# Patient Record
Sex: Female | Born: 1960 | Race: Black or African American | Hispanic: No | Marital: Single | Smoking: Current every day smoker
Health system: Southern US, Community
[De-identification: ages and names within clinical notes are randomized; demographics above are authoritative.]

## PROBLEM LIST (undated history)

## (undated) ENCOUNTER — Emergency Department (HOSPITAL_COMMUNITY): Payer: Self-pay

## (undated) DIAGNOSIS — T7840XA Allergy, unspecified, initial encounter: Secondary | ICD-10-CM

## (undated) DIAGNOSIS — F17200 Nicotine dependence, unspecified, uncomplicated: Secondary | ICD-10-CM

## (undated) DIAGNOSIS — F1090 Alcohol use, unspecified, uncomplicated: Secondary | ICD-10-CM

## (undated) HISTORY — DX: Allergy, unspecified, initial encounter: T78.40XA

## (undated) HISTORY — DX: Alcohol use, unspecified, uncomplicated: F10.90

## (undated) HISTORY — DX: Nicotine dependence, unspecified, uncomplicated: F17.200

---

## 1999-07-12 ENCOUNTER — Encounter: Payer: Self-pay | Admitting: Emergency Medicine

## 1999-07-12 ENCOUNTER — Emergency Department (HOSPITAL_COMMUNITY): Admission: EM | Admit: 1999-07-12 | Discharge: 1999-07-12 | Payer: Self-pay | Admitting: Emergency Medicine

## 2000-02-08 ENCOUNTER — Emergency Department (HOSPITAL_COMMUNITY): Admission: EM | Admit: 2000-02-08 | Discharge: 2000-02-08 | Payer: Self-pay | Admitting: Emergency Medicine

## 2001-03-03 ENCOUNTER — Emergency Department (HOSPITAL_COMMUNITY): Admission: EM | Admit: 2001-03-03 | Discharge: 2001-03-03 | Payer: Self-pay | Admitting: Emergency Medicine

## 2002-06-10 ENCOUNTER — Emergency Department (HOSPITAL_COMMUNITY): Admission: EM | Admit: 2002-06-10 | Discharge: 2002-06-10 | Payer: Self-pay | Admitting: Emergency Medicine

## 2003-03-15 ENCOUNTER — Emergency Department (HOSPITAL_COMMUNITY): Admission: EM | Admit: 2003-03-15 | Discharge: 2003-03-15 | Payer: Self-pay | Admitting: Emergency Medicine

## 2003-03-15 ENCOUNTER — Encounter: Payer: Self-pay | Admitting: Family Medicine

## 2004-11-06 ENCOUNTER — Emergency Department (HOSPITAL_COMMUNITY): Admission: EM | Admit: 2004-11-06 | Discharge: 2004-11-06 | Payer: Self-pay | Admitting: Emergency Medicine

## 2005-05-23 ENCOUNTER — Emergency Department (HOSPITAL_COMMUNITY): Admission: EM | Admit: 2005-05-23 | Discharge: 2005-05-23 | Payer: Self-pay | Admitting: Emergency Medicine

## 2005-05-28 ENCOUNTER — Emergency Department (HOSPITAL_COMMUNITY): Admission: EM | Admit: 2005-05-28 | Discharge: 2005-05-28 | Payer: Self-pay | Admitting: *Deleted

## 2005-07-11 ENCOUNTER — Emergency Department (HOSPITAL_COMMUNITY): Admission: EM | Admit: 2005-07-11 | Discharge: 2005-07-11 | Payer: Self-pay | Admitting: Emergency Medicine

## 2006-12-19 IMAGING — CR DG KNEE 1-2V*L*
2 series · 2 of 2 positions shown · non-contrast
Comparison: None.

CLINICAL DATA: Knee pain ? no injury. 
 LEFT KNEE - 2 VIEW:

[view not recorded (1 of 2)]
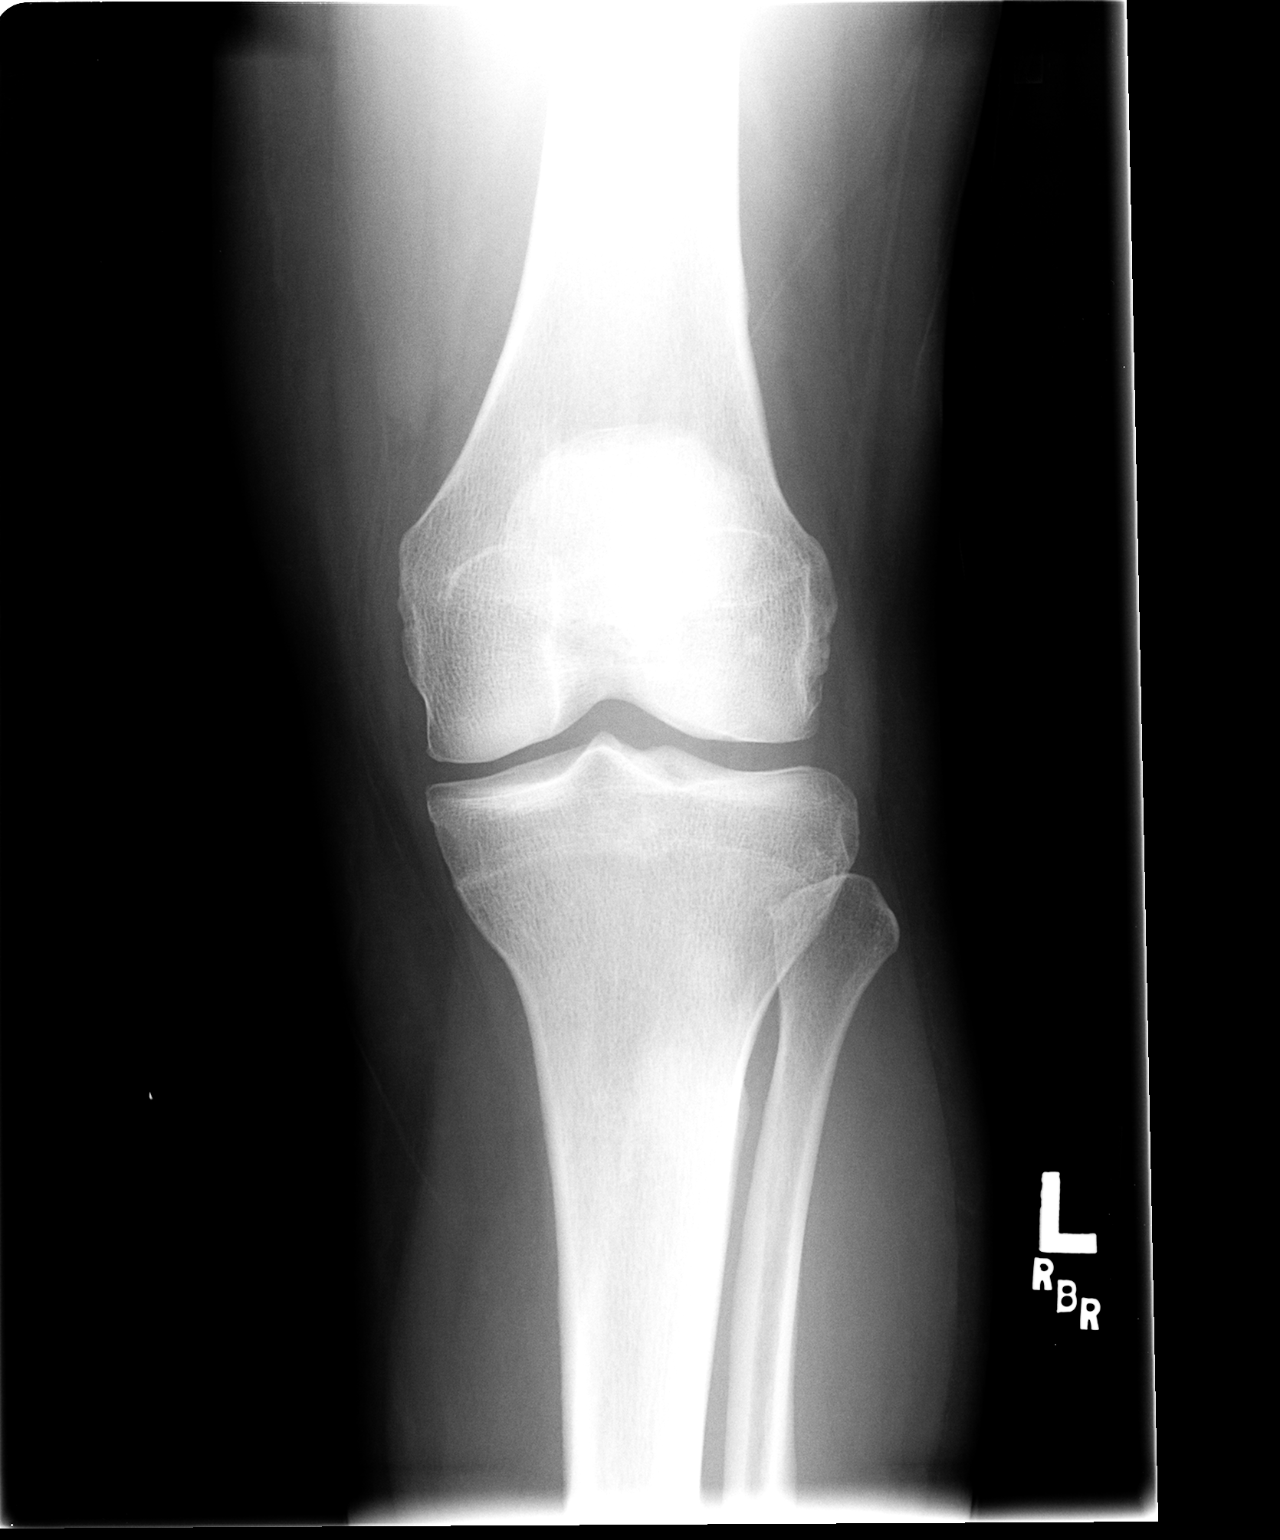

[view not recorded (2 of 2)]
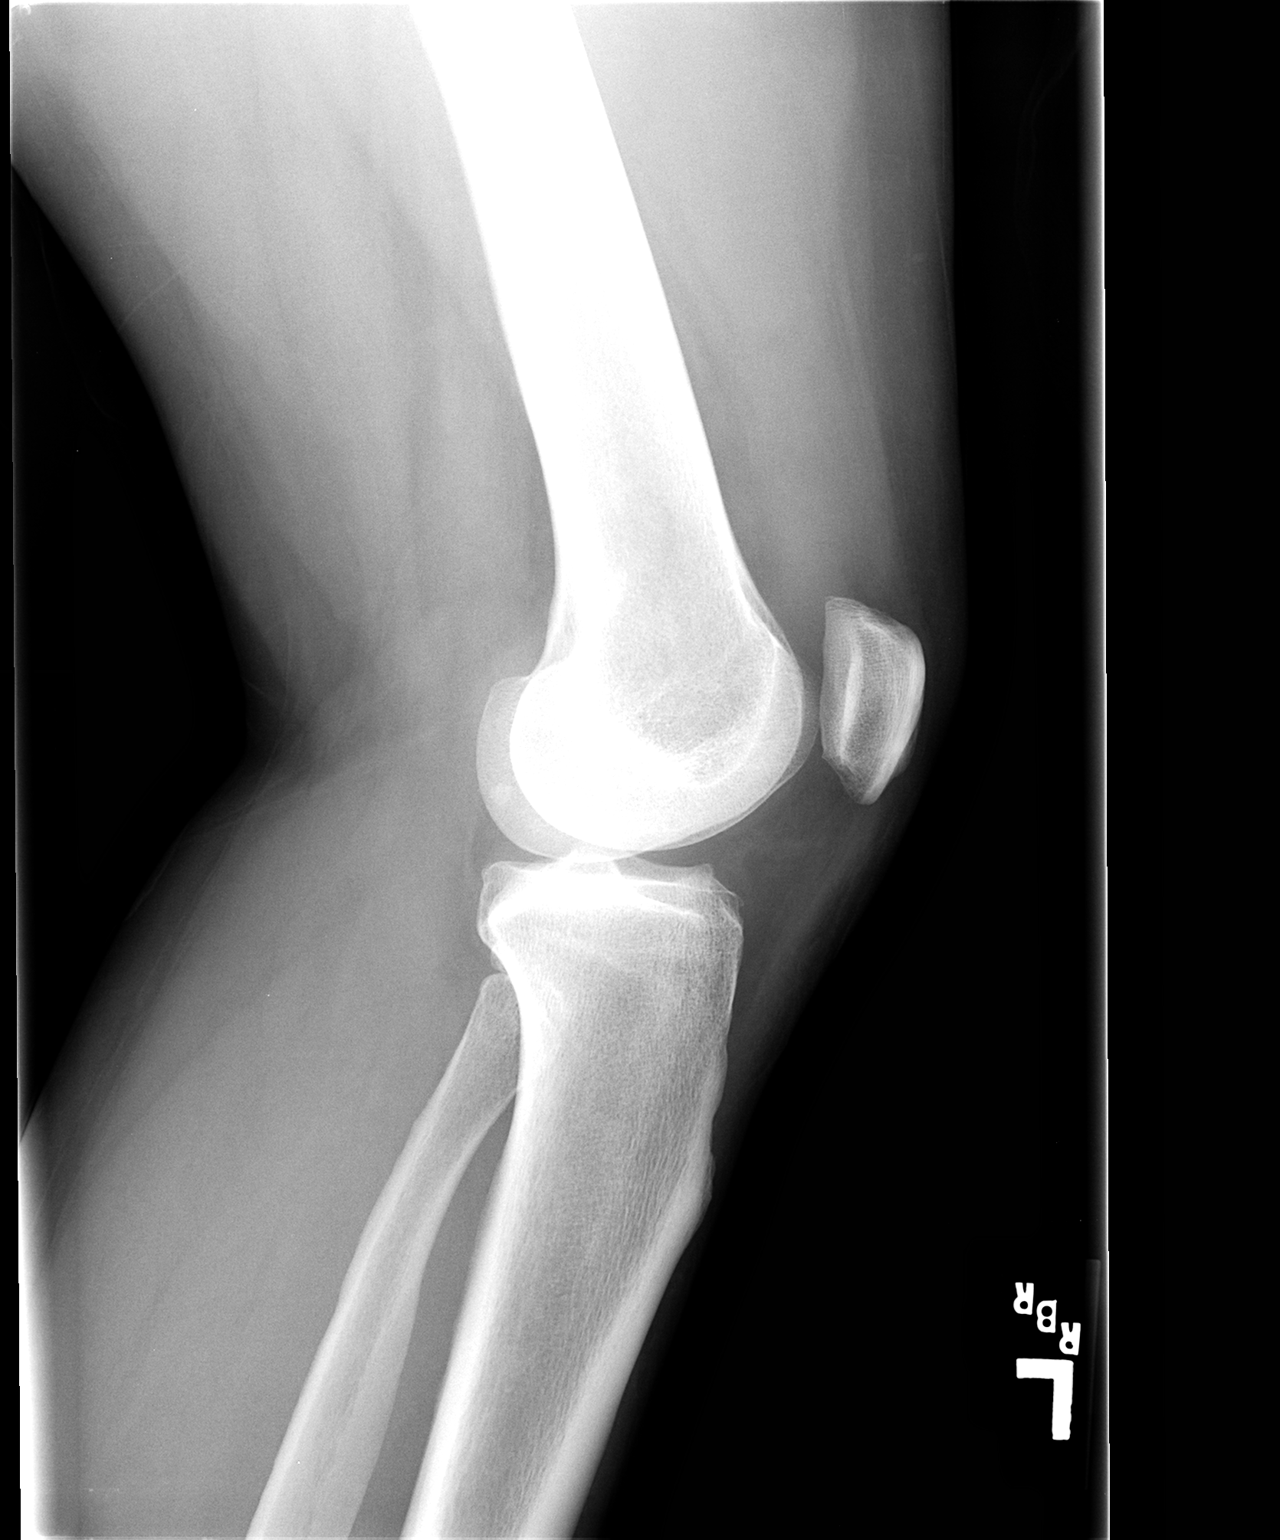

[2 of 2 positions shown; findings below may reference images not displayed]

FINDINGS: There is no acute abnormality or the bony structures.  There is a likely joint effusion on the lateral view.
IMPRESSION: Joint effusion ? bony structures normal.

## 2006-12-24 ENCOUNTER — Emergency Department (HOSPITAL_COMMUNITY): Admission: EM | Admit: 2006-12-24 | Discharge: 2006-12-24 | Payer: Self-pay | Admitting: Emergency Medicine

## 2007-08-08 ENCOUNTER — Emergency Department (HOSPITAL_COMMUNITY): Admission: EM | Admit: 2007-08-08 | Discharge: 2007-08-08 | Payer: Self-pay | Admitting: Emergency Medicine

## 2008-08-30 ENCOUNTER — Emergency Department (HOSPITAL_COMMUNITY): Admission: EM | Admit: 2008-08-30 | Discharge: 2008-08-30 | Payer: Self-pay | Admitting: Emergency Medicine

## 2008-10-25 ENCOUNTER — Emergency Department (HOSPITAL_COMMUNITY): Admission: EM | Admit: 2008-10-25 | Discharge: 2008-10-25 | Payer: Self-pay | Admitting: Emergency Medicine

## 2008-11-04 ENCOUNTER — Emergency Department (HOSPITAL_COMMUNITY): Admission: EM | Admit: 2008-11-04 | Discharge: 2008-11-04 | Payer: Self-pay | Admitting: Emergency Medicine

## 2009-10-10 ENCOUNTER — Emergency Department (HOSPITAL_COMMUNITY): Admission: EM | Admit: 2009-10-10 | Discharge: 2009-10-10 | Payer: Self-pay | Admitting: Emergency Medicine

## 2010-08-24 LAB — URINALYSIS, ROUTINE W REFLEX MICROSCOPIC
Glucose, UA: NEGATIVE mg/dL
Hgb urine dipstick: NEGATIVE
Ketones, ur: NEGATIVE mg/dL
Protein, ur: NEGATIVE mg/dL
pH: 6.5 (ref 5.0–8.0)

## 2010-08-24 LAB — POCT PREGNANCY, URINE: Preg Test, Ur: NEGATIVE

## 2010-08-24 LAB — WET PREP, GENITAL

## 2010-09-14 LAB — URINALYSIS, ROUTINE W REFLEX MICROSCOPIC
Bilirubin Urine: NEGATIVE
Ketones, ur: NEGATIVE mg/dL
Nitrite: NEGATIVE
pH: 6 (ref 5.0–8.0)

## 2010-09-14 LAB — POCT I-STAT, CHEM 8
Calcium, Ion: 1.04 mmol/L — ABNORMAL LOW (ref 1.12–1.32)
Chloride: 109 mEq/L (ref 96–112)
Hemoglobin: 11.9 g/dL — ABNORMAL LOW (ref 12.0–15.0)
Potassium: 3.8 mEq/L (ref 3.5–5.1)
TCO2: 23 mmol/L (ref 0–100)

## 2010-09-14 LAB — WET PREP, GENITAL
Clue Cells Wet Prep HPF POC: NONE SEEN
Yeast Wet Prep HPF POC: NONE SEEN

## 2010-09-14 LAB — GC/CHLAMYDIA PROBE AMP, GENITAL
Chlamydia, DNA Probe: NEGATIVE
GC Probe Amp, Genital: NEGATIVE

## 2010-09-16 LAB — DIFFERENTIAL
Basophils Absolute: 0.1 10*3/uL (ref 0.0–0.1)
Basophils Relative: 1 % (ref 0–1)
Eosinophils Relative: 3 % (ref 0–5)
Monocytes Absolute: 0.3 10*3/uL (ref 0.1–1.0)

## 2010-09-16 LAB — CBC
Platelets: 297 10*3/uL (ref 150–400)
RBC: 3.63 MIL/uL — ABNORMAL LOW (ref 3.87–5.11)
RDW: 19.4 % — ABNORMAL HIGH (ref 11.5–15.5)

## 2010-09-16 LAB — PROTIME-INR: Prothrombin Time: 12.9 seconds (ref 11.6–15.2)

## 2016-09-05 ENCOUNTER — Emergency Department (HOSPITAL_COMMUNITY)
Admission: EM | Admit: 2016-09-05 | Discharge: 2016-09-05 | Disposition: A | Discharge status: Home / Self Care | Payer: Self-pay | Attending: Emergency Medicine | Admitting: Emergency Medicine

## 2016-09-05 ENCOUNTER — Encounter (HOSPITAL_COMMUNITY): Payer: Self-pay

## 2016-09-05 ENCOUNTER — Emergency Department (HOSPITAL_COMMUNITY): Payer: Self-pay

## 2016-09-05 DIAGNOSIS — R1030 Lower abdominal pain, unspecified: Secondary | ICD-10-CM

## 2016-09-05 DIAGNOSIS — R102 Pelvic and perineal pain: Secondary | ICD-10-CM | POA: Insufficient documentation

## 2016-09-05 DIAGNOSIS — F17200 Nicotine dependence, unspecified, uncomplicated: Secondary | ICD-10-CM | POA: Insufficient documentation

## 2016-09-05 DIAGNOSIS — R1032 Left lower quadrant pain: Secondary | ICD-10-CM | POA: Insufficient documentation

## 2016-09-05 DIAGNOSIS — I10 Essential (primary) hypertension: Secondary | ICD-10-CM | POA: Insufficient documentation

## 2016-09-05 DIAGNOSIS — R1031 Right lower quadrant pain: Secondary | ICD-10-CM | POA: Insufficient documentation

## 2016-09-05 LAB — LIPASE, BLOOD: LIPASE: 20 U/L (ref 11–51)

## 2016-09-05 LAB — CBC
HCT: 41.6 % (ref 36.0–46.0)
Hemoglobin: 13.9 g/dL (ref 12.0–15.0)
MCH: 32.6 pg (ref 26.0–34.0)
MCHC: 33.4 g/dL (ref 30.0–36.0)
MCV: 97.4 fL (ref 78.0–100.0)
PLATELETS: 227 10*3/uL (ref 150–400)
RBC: 4.27 MIL/uL (ref 3.87–5.11)
RDW: 13.5 % (ref 11.5–15.5)
WBC: 4.4 10*3/uL (ref 4.0–10.5)

## 2016-09-05 LAB — URINALYSIS, ROUTINE W REFLEX MICROSCOPIC
Bilirubin Urine: NEGATIVE
GLUCOSE, UA: NEGATIVE mg/dL
HGB URINE DIPSTICK: NEGATIVE
KETONES UR: NEGATIVE mg/dL
Leukocytes, UA: NEGATIVE
Nitrite: NEGATIVE
Protein, ur: NEGATIVE mg/dL
Specific Gravity, Urine: 1.017 (ref 1.005–1.030)
pH: 5 (ref 5.0–8.0)

## 2016-09-05 LAB — COMPREHENSIVE METABOLIC PANEL
ALK PHOS: 70 U/L (ref 38–126)
ALT: 18 U/L (ref 14–54)
AST: 21 U/L (ref 15–41)
Albumin: 3.9 g/dL (ref 3.5–5.0)
Anion gap: 8 (ref 5–15)
BUN: 18 mg/dL (ref 6–20)
CALCIUM: 9.3 mg/dL (ref 8.9–10.3)
CHLORIDE: 106 mmol/L (ref 101–111)
CO2: 27 mmol/L (ref 22–32)
CREATININE: 0.78 mg/dL (ref 0.44–1.00)
Glucose, Bld: 91 mg/dL (ref 65–99)
Potassium: 4 mmol/L (ref 3.5–5.1)
SODIUM: 141 mmol/L (ref 135–145)
Total Bilirubin: 0.7 mg/dL (ref 0.3–1.2)
Total Protein: 6.9 g/dL (ref 6.5–8.1)

## 2016-09-05 LAB — PREGNANCY, URINE: Preg Test, Ur: NEGATIVE

## 2016-09-05 MED ORDER — SODIUM CHLORIDE 0.9 % IV BOLUS (SEPSIS)
1000.0000 mL | Freq: Once | INTRAVENOUS | Status: AC
  Administered 2016-09-05: 1000 mL via INTRAVENOUS

## 2016-09-05 MED ORDER — IOPAMIDOL (ISOVUE-300) INJECTION 61%
INTRAVENOUS | Status: AC
  Administered 2016-09-05: 100 mL
  Filled 2016-09-05: qty 100

## 2016-09-05 MED ORDER — KETOROLAC TROMETHAMINE 30 MG/ML IJ SOLN
15.0000 mg | Freq: Once | INTRAMUSCULAR | Status: AC
  Administered 2016-09-05: 15 mg via INTRAVENOUS
  Filled 2016-09-05: qty 1

## 2016-09-05 NOTE — ED Notes (Signed)
Patient transported to CT 

## 2016-09-05 NOTE — Discharge Instructions (Signed)
Please follow up to address your abdominal pain and elevated blood pressure with either [facility] or your own primary care provider.

## 2016-09-05 NOTE — ED Provider Notes (Signed)
[facility]EM[facility]ENCY DEPT Provider Note   CSN: [id number] Arrival date & time: 09/05/16  0856     History   Chief Complaint Chief Complaint  Patient presents with  . Abdominal Pain    HPI Renee Conner is a 56 y.o. female.  Present today for 24 hours of crampy bilateral lower quadrant abdominal pain that is worse in the right lower quadrant.  She reports that the pain is intermittent but has been gradually getting worse. Things that make her pain worse includes standing, twisting. Things that make her pain better include laying still.  She reports that her pain is currently a 4 out of 10 however has been at worst a 8 out of 10. She reports no associated nausea, vomiting, diarrhea, constipation, fevers, chills, appetite changes, or fatigue.  She has never had anything like this before and states that she "never gets sick."  She denies vaginal bleeding, abnormal vaginal discharge, pain with urination or foul odor to urine.  She reports that she is not pregnant as she is not sexually active and has not had a menstrual period since her late 9s.        History reviewed. No pertinent past medical history.  There are no active problems to display for this patient.   History reviewed. No pertinent surgical history.  OB History    No data available       Home Medications    Prior to Admission medications   Not on File    Family History History reviewed. No pertinent family history.  Social History Social History  Substance Use Topics  . Smoking status: Current Every Day Smoker    Packs/day: 1.00  . Smokeless tobacco: Never Used  . Alcohol use 3.6 oz/week    6 Cans of beer per week     Allergies   Patient has no known allergies.   Review of Systems Review of Systems  Constitutional: Negative for activity change, appetite change, chills, fatigue and fever.  HENT: Negative for ear pain, rhinorrhea, sinus pressure, sneezing and sore throat.   Eyes: Negative for pain  and visual disturbance.  Respiratory: Negative for cough, chest tightness and shortness of breath.   Cardiovascular: Negative for chest pain and palpitations.  Gastrointestinal: Positive for abdominal pain. Negative for abdominal distention, blood in stool, constipation, diarrhea, nausea and vomiting.  Genitourinary: Positive for pelvic pain. Negative for decreased urine volume, difficulty urinating, dysuria, flank pain, frequency, hematuria, urgency, vaginal bleeding, vaginal discharge and vaginal pain.  Musculoskeletal: Negative for arthralgias, back pain, neck pain and neck stiffness.  Skin: Negative for color change and rash.  Neurological: Negative for dizziness, seizures, syncope, light-headedness and headaches.  All other systems reviewed and are negative.    Physical Exam Updated Vital Signs BP (!) 134/99   Pulse 76   Temp 98 F (36.7 C) (Oral)   Resp 17   Ht  (1.626 m)   Wt 54.4 kg   SpO2 100%   BMI 20.60 kg/m   Physical Exam  Constitutional: She appears well-developed and well-nourished. No distress.  HENT:  Head: Normocephalic and atraumatic.  Eyes: Conjunctivae are normal. Right eye exhibits no discharge. Left eye exhibits no discharge. No scleral icterus.  Neck: Neck supple. No tracheal deviation present.  Cardiovascular: Normal rate, regular rhythm and normal heart sounds.  Exam reveals no gallop and no friction rub.   No murmur heard. Pulmonary/Chest: Effort normal and breath sounds normal. No respiratory distress.  Abdominal: Soft. Normal appearance and bowel  sounds are normal. She exhibits no distension, no ascites and no mass. There is no hepatosplenomegaly. There is tenderness in the right lower quadrant and left lower quadrant. There is no rebound, no guarding, no tenderness at McBurney's point and negative Murphy's sign. No hernia.    Pain is 3/10 in left lower quadrant, 4/10 in right lower quadrant.  RLQ pain increases with palpation.  No rebound, or  guarding.  Psoas and obturator signs negative.   Musculoskeletal: She exhibits no edema.  Neurological: She is alert. No sensory deficit. She exhibits normal muscle tone.  Skin: Skin is warm and dry.  Psychiatric: She has a normal mood and affect. Her behavior is normal.  Nursing note and vitals reviewed.    ED Treatments / Results  Labs (all labs ordered are listed, but only abnormal results are displayed) Labs Reviewed  LIPASE, BLOOD  COMPREHENSIVE METABOLIC PANEL  CBC  URINALYSIS, ROUTINE W REFLEX MICROSCOPIC  PREGNANCY, URINE    EKG  EKG Interpretation None       Radiology Ct Abdomen Pelvis W Contrast  Result Date: 09/05/2016 CLINICAL DATA:  Right-sided abdominal pain. EXAM: CT ABDOMEN AND PELVIS WITH CONTRAST TECHNIQUE: Multidetector CT imaging of the abdomen and pelvis was performed using the standard protocol following bolus administration of intravenous contrast. CONTRAST:  ISOVUE-300 IOPAMIDOL (ISOVUE-300) INJECTION 61% COMPARISON:  CT scan of November 06, 2004. FINDINGS: Lower chest: No acute abnormality. Hepatobiliary: Minimal cholelithiasis is noted. No focal abnormality is noted in the liver. Pancreas: Unremarkable. No pancreatic ductal dilatation or surrounding inflammatory changes. Spleen: Normal in size without focal abnormality. Adrenals/Urinary Tract: Adrenal glands are unremarkable. Kidneys are normal, without renal calculi, focal lesion, or hydronephrosis. Bladder is unremarkable. Stomach/Bowel: Stomach is within normal limits. Appendix appears normal. No evidence of bowel wall thickening, distention, or inflammatory changes. Vascular/Lymphatic: Aortic atherosclerosis. No enlarged abdominal or pelvic lymph nodes. Reproductive: Uterus and bilateral adnexa are unremarkable. Other: No abdominal wall hernia or abnormality. No abdominopelvic ascites. Musculoskeletal: No acute or significant osseous findings. IMPRESSION: Minimal cholelithiasis. Aortic atherosclerosis. No  other abnormality seen in the abdomen or pelvis. Electronically Signed   By: Lupita Raider, M.D.   On: 09/05/2016 11:54    Procedures Procedures (including critical care time)  Medications Ordered in ED Medications  sodium chloride 0.9 % bolus 1,000 mL (0 mLs Intravenous Stopped 09/05/16 1317)  ketorolac (TORADOL) 30 MG/ML injection 15 mg (15 mg Intravenous Given 09/05/16 1046)  iopamidol (ISOVUE-300) 61 % injection (100 mLs  Contrast Given 09/05/16 1121)     Initial Impression / Assessment and Plan / ED Course  I have reviewed the triage vital signs and the nursing notes.  Pertinent labs & imaging results that were available during my care of the patient were reviewed by me and considered in my medical decision making (see chart for details).    Patient is nontoxic, nonseptic appearing, in no apparent distress.  Patient's pain and other symptoms adequately managed in emergency department.  Fluid bolus given.  Labs, imaging and vitals reviewed.  Patient does not meet the SIRS or Sepsis criteria.  On repeat exam patient does not have a surgical abdomin and there are no peritoneal signs.  No indication of appendicitis, bowel obstruction, bowel perforation, cholecystitis, diverticulitis, PID or ectopic pregnancy.  Patient discharged home with symptomatic treatment and given strict instructions for follow-up with their primary care physician.  I have also discussed reasons to return immediately to the ER.  Patient expresses understanding and agrees with plan.  Patient was also told that many of her blood pressure readings were elevated here in the ED.  I do not suspect hypertensive urgency or emergency based on her symptoms.  She was advised to follow up with wellness center.   Patient care was discussed with Dr. Criss Alvine who personally saw the patient and agrees with my plan.     Final Clinical Impressions(s) / ED Diagnoses   Final diagnoses:  Lower abdominal pain  Hypertension, unspecified  type    New Prescriptions New Prescriptions   No medications on file     Cristina Gong, PA-C 09/05/16 1322    Pricilla Loveless, MD 09/06/16 [phone]

## 2016-09-05 NOTE — ED Triage Notes (Signed)
Pt states RLQ abd pain. She describes the pain as cramping that is worse with standing or movement. Pt denies nausea or vomiting.

## 2016-09-30 ENCOUNTER — Encounter (HOSPITAL_COMMUNITY): Payer: Self-pay

## 2016-09-30 ENCOUNTER — Emergency Department (HOSPITAL_COMMUNITY): Payer: Self-pay

## 2016-09-30 DIAGNOSIS — Y9289 Other specified places as the place of occurrence of the external cause: Secondary | ICD-10-CM | POA: Insufficient documentation

## 2016-09-30 DIAGNOSIS — F17200 Nicotine dependence, unspecified, uncomplicated: Secondary | ICD-10-CM | POA: Insufficient documentation

## 2016-09-30 DIAGNOSIS — S8002XA Contusion of left knee, initial encounter: Secondary | ICD-10-CM | POA: Insufficient documentation

## 2016-09-30 DIAGNOSIS — Y999 Unspecified external cause status: Secondary | ICD-10-CM | POA: Insufficient documentation

## 2016-09-30 DIAGNOSIS — W109XXA Fall (on) (from) unspecified stairs and steps, initial encounter: Secondary | ICD-10-CM | POA: Insufficient documentation

## 2016-09-30 DIAGNOSIS — Y9389 Activity, other specified: Secondary | ICD-10-CM | POA: Insufficient documentation

## 2016-09-30 NOTE — ED Triage Notes (Signed)
Pt states fell off step landed onto left knee. Pt complaining of L knee pain. Pt ambulatory at triage, full ROM.

## 2016-10-01 ENCOUNTER — Emergency Department (HOSPITAL_COMMUNITY)
Admission: EM | Admit: 2016-10-01 | Discharge: 2016-10-01 | Disposition: A | Discharge status: Home / Self Care | Payer: Self-pay | Attending: Emergency Medicine | Admitting: Emergency Medicine

## 2016-10-01 DIAGNOSIS — S8002XA Contusion of left knee, initial encounter: Secondary | ICD-10-CM

## 2016-10-01 MED ORDER — MELOXICAM 7.5 MG PO TABS: 7.5000 mg | ORAL_TABLET | Freq: Every day | ORAL | 0 refills | Status: AC

## 2016-10-01 NOTE — ED Provider Notes (Signed)
[facility] Provider Note   CSN: [id number] Arrival date & time: 09/30/16  2259     History   Chief Complaint Chief Complaint  Patient presents with  . Knee Pain    HPI Renee Conner is a 56 y.o. female.  Patient presents with complaint of knee pain starting acutely one week ago when she fell off a dresser while hanging a picture. Patient denies head injury or losing consciousness. She states that she could not get up off the floor for several minutes. She has been walking with a limp and having pain in the left knee since that time. She has tried Tylenol without relief. She has a bruise to the right inner thigh but denies other injuries.      History reviewed. No pertinent past medical history.  There are no active problems to display for this patient.   History reviewed. No pertinent surgical history.  OB History    No data available       Home Medications    Prior to Admission medications   Medication Sig Start Date End Date Taking? Authorizing Provider  meloxicam (MOBIC) 7.5 MG tablet Take 1 tablet (7.5 mg total) by mouth daily. 10/01/16   Renne Crigler, PA-C    Family History History reviewed. No pertinent family history.  Social History Social History  Substance Use Topics  . Smoking status: Current Every Day Smoker    Packs/day: 1.00  . Smokeless tobacco: Never Used  . Alcohol use 3.6 oz/week    6 Cans of beer per week     Allergies   Patient has no known allergies.   Review of Systems Review of Systems  Constitutional: Negative for activity change.  Musculoskeletal: Positive for arthralgias and gait problem. Negative for back pain, joint swelling and neck pain.  Skin: Negative for wound.  Neurological: Negative for weakness and numbness.     Physical Exam Updated Vital Signs BP 120/81 (BP Location: Left Arm)   Pulse 87   Temp 97.9 F (36.6 C) (Oral)   Resp 18   SpO2 97%   Physical Exam  Constitutional: She appears  well-developed and well-nourished.  HENT:  Head: Normocephalic and atraumatic.  Eyes: Pupils are equal, round, and reactive to light.  Neck: Normal range of motion. Neck supple.  Cardiovascular: Normal pulses.  Exam reveals no decreased pulses.   Musculoskeletal: She exhibits tenderness. She exhibits no edema.  Patient can flex and extend the left knee against resistance. No significant joint effusion noted. Tenderness anteriorly. No focal tenderness over the tibial plateau.  Neurological: She is alert. No sensory deficit.  Motor, sensation, and vascular distal to the injury is fully intact.   Skin: Skin is warm and dry.  Psychiatric: She has a normal mood and affect.  Nursing note and vitals reviewed.    ED Treatments / Results   Radiology Dg Knee Complete 4 Views Left  Result Date: 09/30/2016 CLINICAL DATA:  Fall and landed on left knee EXAM: LEFT KNEE - COMPLETE 4+ VIEW COMPARISON:  08/08/2007 FINDINGS: No fracture or dislocation. Mild degenerative changes of the patellofemoral, medial and lateral compartments. Trace suprapatellar effusion. IMPRESSION: 1. No acute osseous abnormality. 2. Mild degenerative changes.  Trace suprapatellar effusion Electronically Signed   By: Jasmine Pang M.D.   On: 09/30/2016 23:47    Procedures Procedures (including critical care time)  Medications Ordered in ED Medications - No data to display   Initial Impression / Assessment and Plan / ED Course  I have  reviewed the triage vital signs and the nursing notes.  Pertinent labs & imaging results that were available during my care of the patient were reviewed by me and considered in my medical decision making (see chart for details).     Vital signs reviewed and are as follows: Vitals:   09/30/16 2308 10/01/16 0335  BP: (!) 140/95 120/81  Pulse: 94 87  Resp: 20 18  Temp: 98.4 F (36.9 C) 97.9 F (36.6 C)   Patient informed of x-ray results. Will provide with knee sleeve for stability.  Will place on NSAIDs. Discussed that if she cannot afford meloxicam she may take naproxen twice a day.  Patient was counseled on RICE protocol and told to rest injury, use ice for no longer than 15 minutes every hour, compress the area, and elevate above the level of their heart as much as possible to reduce swelling. Questions answered. Patient verbalized understanding.     Final Clinical Impressions(s) / ED Diagnoses   Final diagnoses:  Contusion of left knee, initial encounter   Patient with left knee contusion after fall. Imaging negative. [place] is neurovascularly intact. Patient is ambulatory. Conservative measures indicated with follow-up if not improving.  20% oxygen saturation documented on arrival is likely an error. Patient is no respiratory distress.  New Prescriptions New Prescriptions   MELOXICAM (MOBIC) 7.5 MG TABLET    Take 1 tablet (7.5 mg total) by mouth daily.     Renne Crigler, PA-C 10/01/16 1610    Tomasita Crumble, MD 10/01/16 [phone]

## 2016-10-01 NOTE — Discharge Instructions (Signed)
Please read and follow all provided instructions.  Your diagnoses today include:  1. Contusion of left knee, initial encounter     Tests performed today include:  An x-ray of the affected area - does NOT show any broken bones  Vital signs. See below for your results today.   Medications prescribed:   Meloxicam - anti-inflammatory pain medication  You have been prescribed an anti-inflammatory medication or NSAID. Take with food. Do not take aspirin, ibuprofen, or naproxen if taking this medication. Take smallest effective dose for the shortest duration needed for your pain. Stop taking if you experience stomach pain or vomiting.    Take any prescribed medications only as directed.  Home care instructions:   Follow any educational materials contained in this packet  Follow R.I.C.E. Protocol:  R - rest your injury   I  - use ice on injury without applying directly to skin  C - compress injury with bandage or splint  E - elevate the injury as much as possible  Follow-up instructions: Please follow-up with your primary care provider or the provided orthopedic physician (bone specialist) if you continue to have significant pain in 1 week. In this case you may have a more severe injury that requires further care.   Return instructions:   Please return if your toes or feet are numb or tingling, appear gray or blue, or you have severe pain (also elevate the leg and loosen splint or wrap if you were given one)  Please return to the Emergency Department if you experience worsening symptoms.   Please return if you have any other emergent concerns.  Additional Information:  Your vital signs today were: BP 120/81 (BP Location: Left Arm)    Pulse 87    Temp 97.9 F (36.6 C) (Oral)    Resp 18    SpO2 97%  If your blood pressure (BP) was elevated above 135/85 this visit, please have this repeated by your doctor within one month.

## 2016-10-01 NOTE — Progress Notes (Signed)
Orthopedic Tech Progress Note Patient Details:  Renee Conner 10/14/1960 [id number]  Ortho Devices Type of Ortho Device: Knee Sleeve Ortho Device/Splint Location: lle Ortho Device/Splint Interventions: Ordered, Application   Renee Conner 10/01/2016, 4:58 AM

## 2016-10-01 NOTE — ED Notes (Signed)
Pt states that last Saturday, 09/24/16, she was hanging a photo when she fell and landed on her left knee. Pt states it is painful and swollen.

## 2018-02-13 IMAGING — CT CT ABD-PELV W/ CM
2 of 5 series · 17 of 46 positions shown, 19 images · IV contrast (iopamidol)
Comparison: CT scan of November 06, 2004.

CLINICAL DATA: Right-sided abdominal pain.

EXAM:
CT ABDOMEN AND PELVIS WITH CONTRAST
TECHNIQUE: Multidetector CT imaging of the abdomen and pelvis was performed
using the standard protocol following bolus administration of
intravenous contrast.
CONTRAST:  100mL 6WMYL2-LJJ IOPAMIDOL (6WMYL2-LJJ) INJECTION 61%

[Series 5: a/p w/ 5mm · axial · 0.70mm/px · z∈[-640,-290]mm · 14 of 80 slices shown, 16 images]
[im 5/80  soft-tissue]
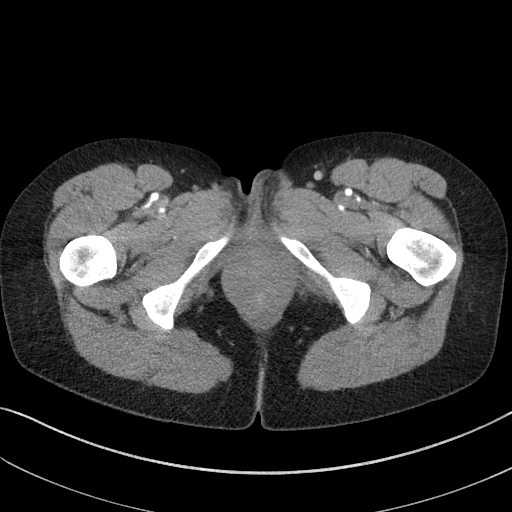
[im 5/80  bone]
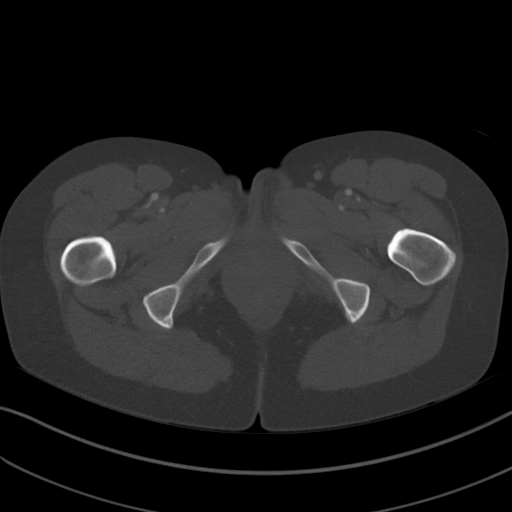
[im 9/80  soft-tissue]
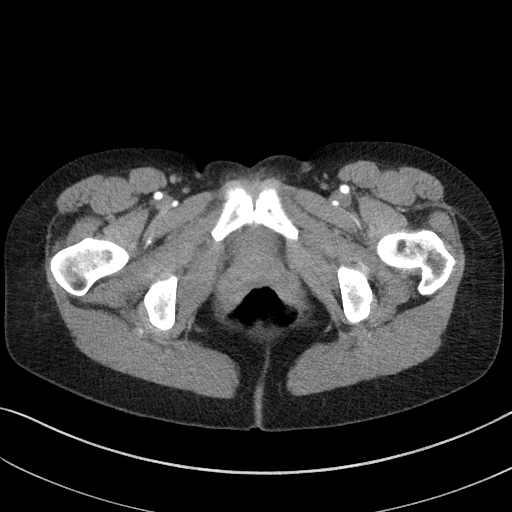
[im 17/80  soft-tissue]
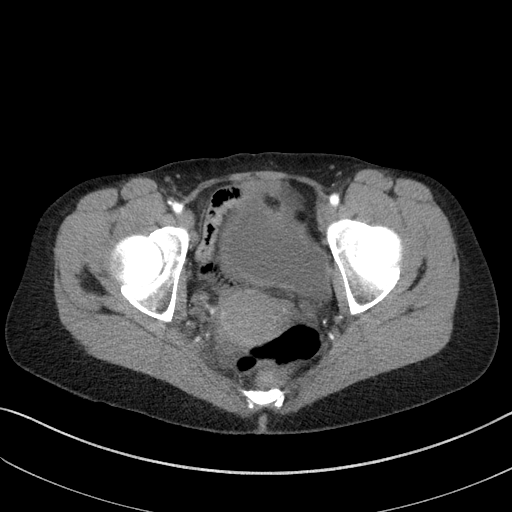
[im 21/80  soft-tissue]
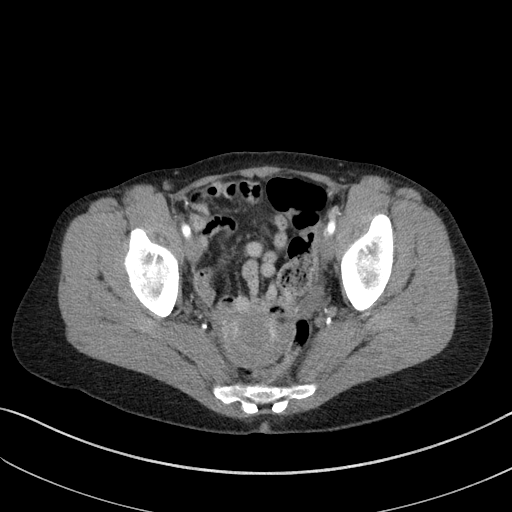
[im 25/80  soft-tissue]
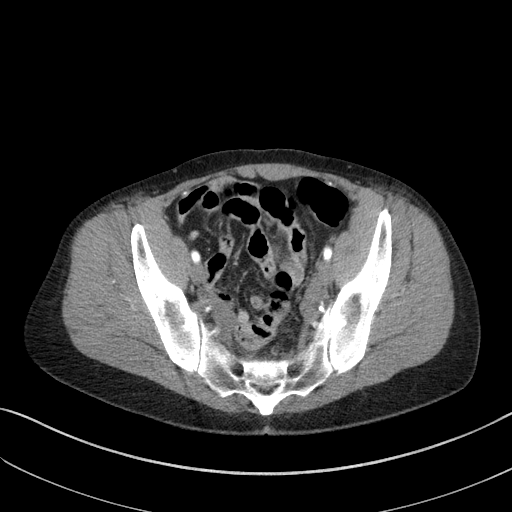
[im 34/80  soft-tissue]
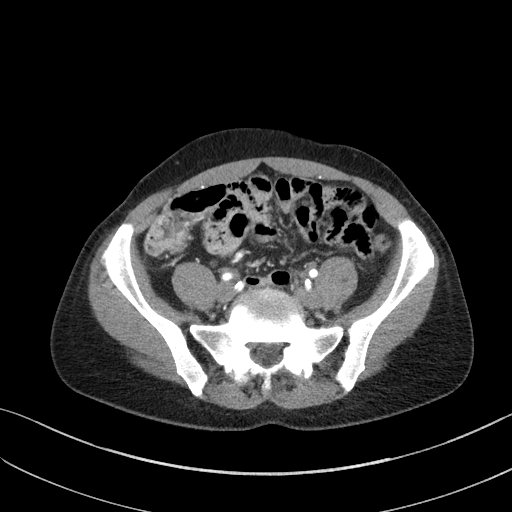
[im 38/80  soft-tissue]
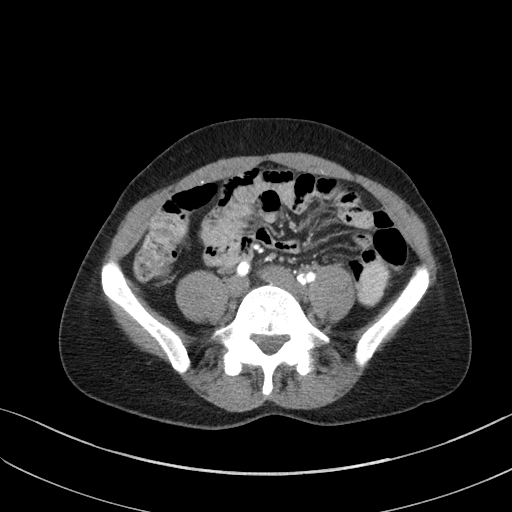
[im 42/80  soft-tissue]
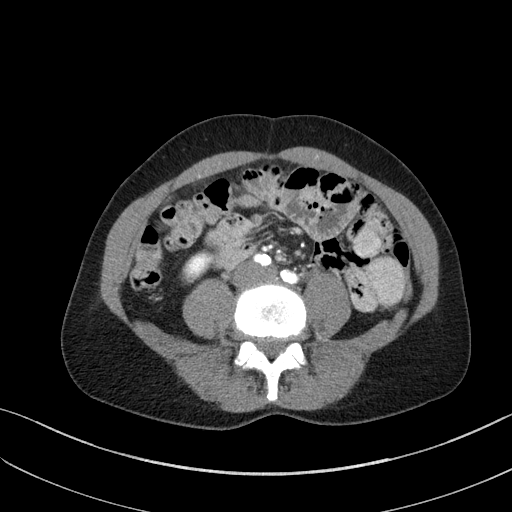
[im 46/80  soft-tissue]
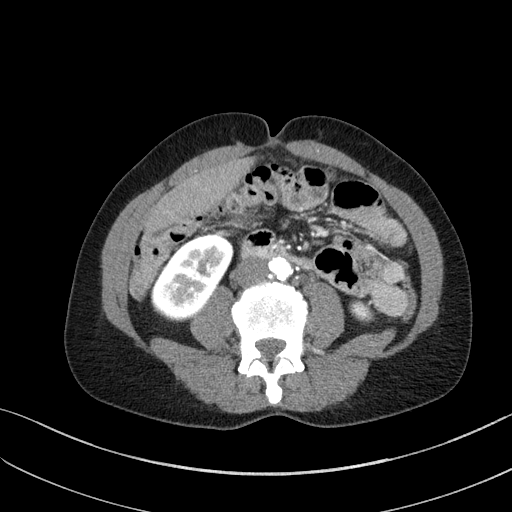
[im 46/80  bone]
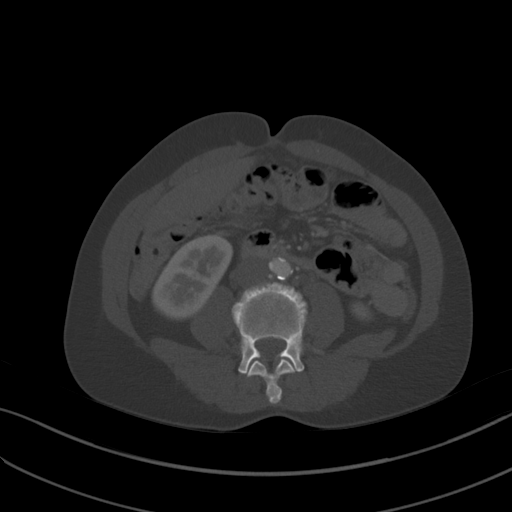
[im 55/80  soft-tissue]
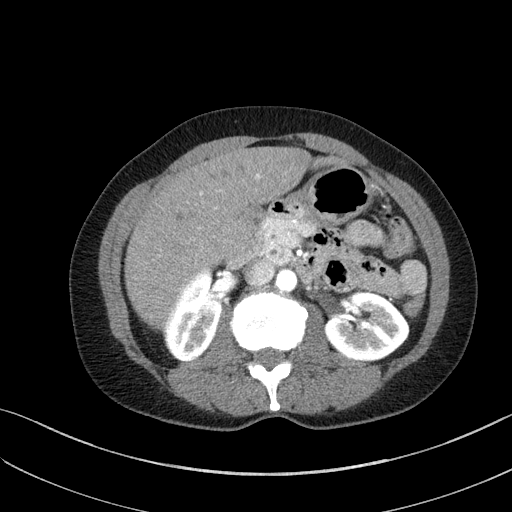
[im 59/80  soft-tissue]
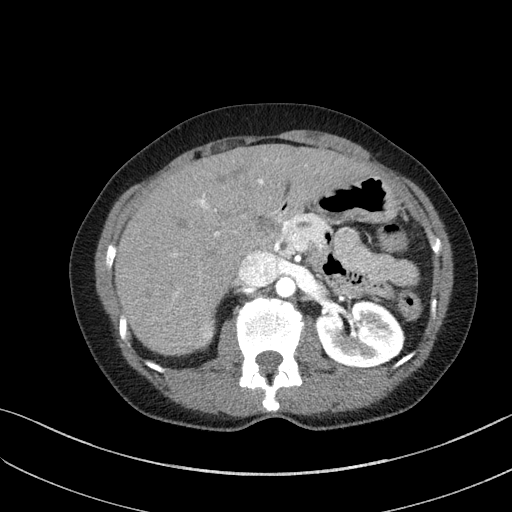
[im 63/80  soft-tissue]
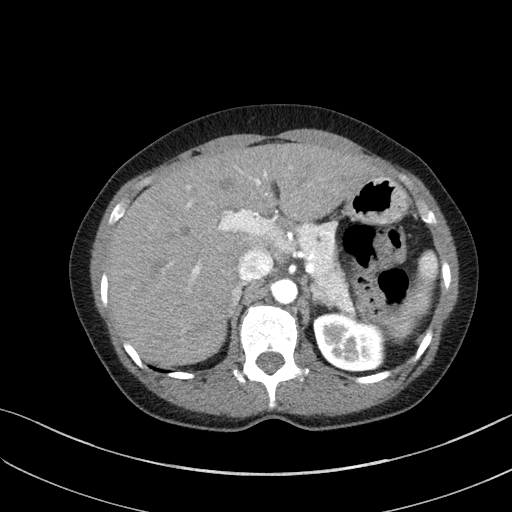
[im 71/80  soft-tissue]
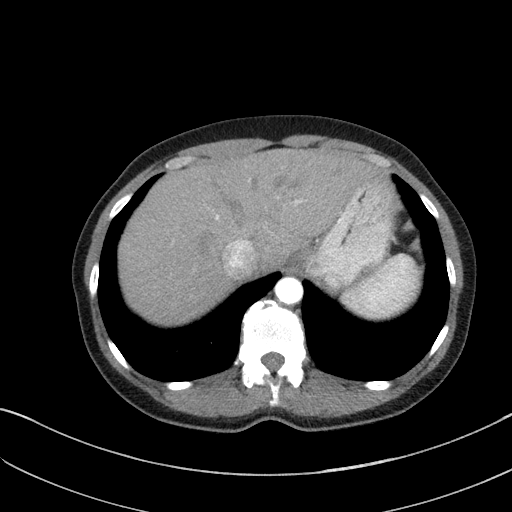
[im 75/80  soft-tissue]
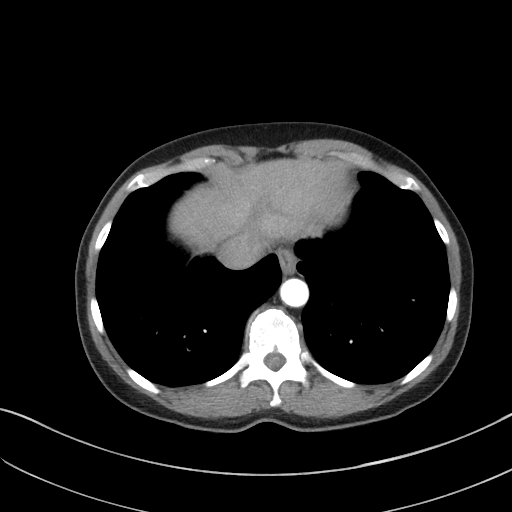

[Series 6: a/p w/ cor · coronal · 0.69mm/px · 3 of 141 slices shown]
[im 47/141  soft-tissue]
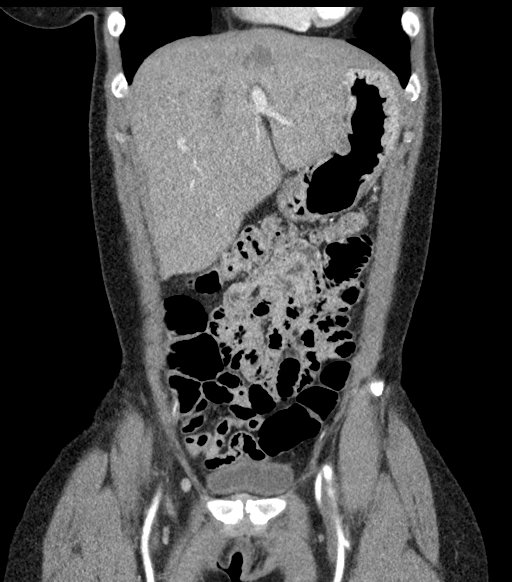
[im 63/141  soft-tissue]
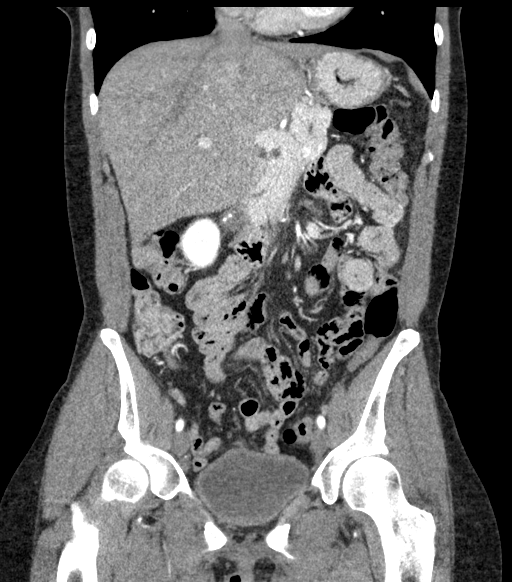
[im 78/141  soft-tissue]
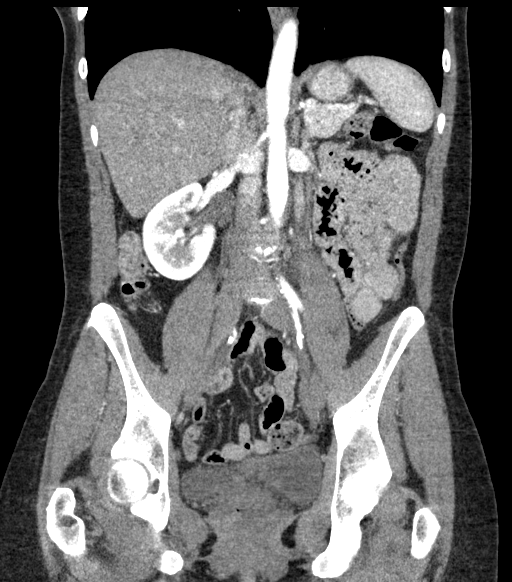

[17 of 46 positions shown; findings below may reference images not displayed]

FINDINGS: Lower chest: No acute abnormality.

Hepatobiliary: Minimal cholelithiasis is noted. No focal abnormality
is noted in the liver.

Pancreas: Unremarkable. No pancreatic ductal dilatation or
surrounding inflammatory changes.

Spleen: Normal in size without focal abnormality.

Adrenals/Urinary Tract: Adrenal glands are unremarkable. Kidneys are
normal, without renal calculi, focal lesion, or hydronephrosis.
Bladder is unremarkable.

Stomach/Bowel: Stomach is within normal limits. Appendix appears
normal. No evidence of bowel wall thickening, distention, or
inflammatory changes.

Vascular/Lymphatic: Aortic atherosclerosis. No enlarged abdominal or
pelvic lymph nodes.

Reproductive: Uterus and bilateral adnexa are unremarkable.

Other: No abdominal wall hernia or abnormality. No abdominopelvic
ascites.

Musculoskeletal: No acute or significant osseous findings.
IMPRESSION: Minimal cholelithiasis. Aortic atherosclerosis. No other abnormality
seen in the abdomen or pelvis.

## 2021-01-06 ENCOUNTER — Other Ambulatory Visit: Payer: Self-pay

## 2021-01-06 ENCOUNTER — Encounter (HOSPITAL_COMMUNITY): Payer: Self-pay

## 2021-01-06 ENCOUNTER — Emergency Department (HOSPITAL_COMMUNITY)
Admission: EM | Admit: 2021-01-06 | Discharge: 2021-01-06 | Disposition: A | Discharge status: Home / Self Care | Payer: Self-pay | Attending: Emergency Medicine | Admitting: Emergency Medicine

## 2021-01-06 DIAGNOSIS — L237 Allergic contact dermatitis due to plants, except food: Secondary | ICD-10-CM | POA: Insufficient documentation

## 2021-01-06 DIAGNOSIS — F17210 Nicotine dependence, cigarettes, uncomplicated: Secondary | ICD-10-CM | POA: Insufficient documentation

## 2021-01-06 MED ORDER — HYDROXYZINE HCL 25 MG PO TABS
25.0000 mg | ORAL_TABLET | Freq: Once | ORAL | Status: AC
  Administered 2021-01-06: 25 mg via ORAL
  Filled 2021-01-06: qty 1

## 2021-01-06 MED ORDER — PREDNISONE 10 MG (21) PO TBPK: ORAL_TABLET | Freq: Every day | ORAL | 0 refills | Status: DC

## 2021-01-06 MED ORDER — HYDROXYZINE HCL 25 MG PO TABS: 25.0000 mg | ORAL_TABLET | Freq: Three times a day (TID) | ORAL | 0 refills | Status: AC | PRN

## 2021-01-06 NOTE — ED Triage Notes (Signed)
Patient complains of poison ivy to body with burning and itching x 4 days. Has tried otc meds with no relief

## 2021-01-06 NOTE — ED Provider Notes (Signed)
Emergency Medicine Provider Triage Evaluation Note  Renee Conner , a 60 y.o. female  was evaluated in triage.  Pt complains of rash.  Patient states she was doing yard work and then developed a rash.  Consistent with poison ivy.  She states is itching a lot.  She is taking calamine without improvement.  Review of Systems  Positive: rash Negative: pain  Physical Exam  BP (!) 175/108   Pulse (!) 101   Temp 98.9 F (37.2 C) (Oral)   Resp 16   SpO2 98%  Gen:   Awake, no distress   Resp:  Normal effort  MSK:   Moves extremities without difficulty    Medical Decision Making  Medically screening exam initiated at 5:03 PM.  Appropriate orders placed.  Bradley [place] was informed that the remainder of the evaluation will be completed by another provider, this initial triage assessment does not replace that evaluation, and the importance of remaining in the ED until their evaluation is complete.     Alveria Apley, PA-C 01/06/21 1704    Mancel Bale, MD 01/06/21 [phone]

## 2021-01-06 NOTE — ED Notes (Signed)
The pt is c/o a rash over her body for 4 days  She was working outside and she reports that it was poison ivy   She has tried [employer] but nothing is working and it is spreading over her body

## 2021-01-06 NOTE — ED Provider Notes (Signed)
[facility] EMERGENCY DEPARTMENT Provider Note   CSN: [id number] Arrival date & time: 01/06/21  1617     History No chief complaint on file.   Renee Conner is a 60 y.o. female.  60 year old female with no significant past medical history presents with complaint of itching rash to bilateral upper and lower extremities onset 1 week ago after helping her sister with yard work.  Reports prior similar reaction to poison ivy.  Patient has been applying calamine lotion without improvement.  No other complaints or concerns.      History reviewed. No pertinent past medical history.  There are no problems to display for this patient.   History reviewed. No pertinent surgical history.   OB History   No obstetric history on file.     No family history on file.  Social History   Tobacco Use   Smoking status: Every Day    Packs/day: 1.00    Types: Cigarettes   Smokeless tobacco: Never  Substance Use Topics   Alcohol use: Yes    Alcohol/week: 6.0 standard drinks    Types: 6 Cans of beer per week    Home Medications Prior to Admission medications   Medication Sig Start Date End Date Taking? Authorizing Provider  hydrOXYzine (ATARAX/VISTARIL) 25 MG tablet Take 1 tablet (25 mg total) by mouth every 8 (eight) hours as needed for itching. 01/06/21  Yes Jeannie Fend, PA-C  predniSONE (STERAPRED UNI-PAK 21 TAB) 10 MG (21) TBPK tablet Take by mouth daily. Take 6 tabs by mouth daily  for 2 days, then 5 tabs for 2 days, then 4 tabs for 2 days, then 3 tabs for 2 days, 2 tabs for 2 days, then 1 tab by mouth daily for 2 days 01/06/21  Yes Jeannie Fend, PA-C  meloxicam (MOBIC) 7.5 MG tablet Take 1 tablet (7.5 mg total) by mouth daily. 10/01/16   Renne Crigler, PA-C    Allergies    Patient has no known allergies.  Review of Systems   Review of Systems  Constitutional:  Negative for fever.  HENT:  Negative for trouble swallowing and voice change.   Respiratory:   Negative for shortness of breath.   Cardiovascular:  Negative for chest pain.  Gastrointestinal:  Negative for nausea and vomiting.  Musculoskeletal:  Negative for arthralgias, joint swelling and myalgias.  Skin:  Positive for rash. Negative for wound.  Allergic/Immunologic: Negative for immunocompromised state.  Neurological:  Negative for weakness and numbness.  Hematological:  Negative for adenopathy.  Psychiatric/Behavioral:  Negative for confusion.   All other systems reviewed and are negative.  Physical Exam Updated Vital Signs BP (!) 175/108   Pulse (!) 101   Temp 98.9 F (37.2 C) (Oral)   Resp 16   SpO2 98%   Physical Exam Vitals and nursing note reviewed.  Constitutional:      General: She is not in acute distress.    Appearance: She is well-developed. She is not diaphoretic.  HENT:     Head: Normocephalic and atraumatic.     Mouth/Throat:     Mouth: Mucous membranes are moist.     Pharynx: Oropharynx is clear.  Eyes:     Conjunctiva/sclera: Conjunctivae normal.  Cardiovascular:     Rate and Rhythm: Normal rate and regular rhythm.     Heart sounds: Normal heart sounds.  Pulmonary:     Effort: Pulmonary effort is normal.     Breath sounds: Normal breath sounds.  Musculoskeletal:  General: No swelling, tenderness or deformity.     Cervical back: Normal range of motion.     Right lower leg: No edema.     Left lower leg: No edema.  Skin:    General: Skin is warm and dry.     Findings: Rash present. No erythema.     Comments: Linear groupings of vesicles to bilateral upper extremities without evidence of secondary infection.  Neurological:     Mental Status: She is alert and oriented to person, place, and time.     Sensory: No sensory deficit.     Motor: No weakness.  Psychiatric:        Behavior: Behavior normal.    ED Results / Procedures / Treatments   Labs (all labs ordered are listed, but only abnormal results are displayed) Labs Reviewed - No  data to display  EKG None  Radiology No results found.  Procedures Procedures   Medications Ordered in ED Medications  hydrOXYzine (ATARAX/VISTARIL) tablet 25 mg (has no administration in time range)    ED Course  I have reviewed the triage vital signs and the nursing notes.  Pertinent labs & imaging results that were available during my care of the patient were reviewed by me and considered in my medical decision making (see chart for details).  Clinical Course as of 01/06/21 1929  Wed Jan 06, 2021  [id number] 60 year old female with likely contact dermatitis from possible poison ivy exposure with rash x1 week.  Patient with constant itching during exam.  Discussed avoiding itching to prevent developing infection.  Plan is to treat her itching with hydroxyzine, discussed self-limited rash however due to spread to all 4 extremities, will cover with short course of prednisone. [LM]    Clinical Course User Index [LM] Alden Hipp   MDM Rules/Calculators/A&P                           Final Clinical Impression(s) / ED Diagnoses Final diagnoses:  Allergic contact dermatitis due to plants, except food    Rx / DC Orders ED Discharge Orders          Ordered    predniSONE (STERAPRED UNI-PAK 21 TAB) 10 MG (21) TBPK tablet  Daily        01/06/21 1915    hydrOXYzine (ATARAX/VISTARIL) 25 MG tablet  Every 8 hours PRN        01/06/21 1915             Jeannie Fend, PA-C 01/06/21 1929    Charlynne Pander, MD 01/06/21 [phone]

## 2021-01-06 NOTE — Discharge Instructions (Addendum)
Avoid hot showers as this can make the itching worse. You may apply topical Domeboro's compresses to rash to help with itching. Take Atarax as needed as prescribed for itching. Take prednisone as prescribed and complete the full course. You may also take Zyrtec to help with itching.

## 2023-01-13 ENCOUNTER — Emergency Department (HOSPITAL_COMMUNITY)
Admission: EM | Admit: 2023-01-13 | Discharge: 2023-01-13 | Disposition: A | Discharge status: Home / Self Care | Payer: Medicaid Other | Attending: Emergency Medicine | Admitting: Emergency Medicine

## 2023-01-13 ENCOUNTER — Encounter (HOSPITAL_COMMUNITY): Payer: Self-pay | Admitting: Emergency Medicine

## 2023-01-13 ENCOUNTER — Other Ambulatory Visit: Payer: Self-pay

## 2023-01-13 DIAGNOSIS — R430 Anosmia: Secondary | ICD-10-CM | POA: Insufficient documentation

## 2023-01-13 DIAGNOSIS — Z1152 Encounter for screening for COVID-19: Secondary | ICD-10-CM | POA: Insufficient documentation

## 2023-01-13 LAB — SARS CORONAVIRUS 2 BY RT PCR: SARS Coronavirus 2 by RT PCR: NEGATIVE

## 2023-01-13 MED ORDER — LORATADINE 10 MG PO TABS: 10.0000 mg | ORAL_TABLET | Freq: Every day | ORAL | 1 refills | Status: DC

## 2023-01-13 MED ORDER — FLUTICASONE PROPIONATE 50 MCG/ACT NA SUSP: 1.0000 | Freq: Every day | NASAL | 2 refills | Status: DC

## 2023-01-13 NOTE — ED Provider Notes (Signed)
[place] EMERGENCY DEPARTMENT AT [facility] Provider Note   CSN: [id number] Arrival date & time: 01/13/23  0825     History  Chief Complaint  Patient presents with   no taste    Renee Conner is a 62 y.o. female.  HPI 62 year old female presents with lack of smell and taste.  She states this has been ongoing for few months.  She also feels like she has a very dry nose.  She has been trying to scratch it and help with either her finger or a Bobby pin.  Occasionally she will have some bleeding.  Has not gotten particularly worse but she felt like she needed to be seen today.  She does have some on and off sneezing and eye itching.  No sore throat.  She states she has been adding extra salt to her food to help with taste, which she can taste the extra salt.  She denies feeling acutely ill though does state she has a mild cough over the last week or so.  However no productive sputum or hemoptysis.  No fevers, weight loss, chest pain, shortness of breath.  Home Medications Prior to Admission medications   Medication Sig Start Date End Date Taking? Authorizing Provider  hydrOXYzine (ATARAX/VISTARIL) 25 MG tablet Take 1 tablet (25 mg total) by mouth every 8 (eight) hours as needed for itching. 01/06/21   Jeannie Fend, PA-C  meloxicam (MOBIC) 7.5 MG tablet Take 1 tablet (7.5 mg total) by mouth daily. 10/01/16   Renne Crigler, PA-C  predniSONE (STERAPRED UNI-PAK 21 TAB) 10 MG (21) TBPK tablet Take by mouth daily. Take 6 tabs by mouth daily  for 2 days, then 5 tabs for 2 days, then 4 tabs for 2 days, then 3 tabs for 2 days, 2 tabs for 2 days, then 1 tab by mouth daily for 2 days 01/06/21   Jeannie Fend, PA-C      Allergies    Patient has no known allergies.    Review of Systems   Review of Systems  Constitutional:  Negative for fever and unexpected weight change.  HENT:  Negative for sore throat.   Respiratory:  Positive for cough. Negative for shortness of breath.    Cardiovascular:  Negative for chest pain.    Physical Exam Updated Vital Signs BP (!) 147/103 (BP Location: Right Arm)   Pulse 89   Temp 98.2 F (36.8 C) (Oral)   Resp 17   Ht 5\' 4"  (1.626 m)   Wt 59 kg   SpO2 99%   BMI 22.31 kg/m  Physical Exam Vitals and nursing note reviewed.  Constitutional:      General: She is not in acute distress.    Appearance: She is well-developed. She is not ill-appearing or diaphoretic.  HENT:     Head: Normocephalic and atraumatic.     Nose:     Comments: Seems to have some mildly swollen turbinates bilaterally. No obvious masses or bleeding    Mouth/Throat:     Pharynx: Oropharynx is clear.  Cardiovascular:     Rate and Rhythm: Normal rate and regular rhythm.     Heart sounds: Normal heart sounds.  Pulmonary:     Effort: Pulmonary effort is normal.     Breath sounds: Normal breath sounds. No wheezing, rhonchi or rales.  Abdominal:     General: There is no distension.  Skin:    General: Skin is warm and dry.  Neurological:     Mental  Status: She is alert.     ED Results / Procedures / Treatments   Labs (all labs ordered are listed, but only abnormal results are displayed) Labs Reviewed  SARS CORONAVIRUS 2 BY RT PCR    EKG None  Radiology No results found.  Procedures Procedures    Medications Ordered in ED Medications - No data to display  ED Course/ Medical Decision Making/ A&P                                 Medical Decision Making  Patient's lack of smell may be related to allergies/congestion.  I think is reasonable to start her on an antihistamine and steroid nasal spray and then have her follow-up with ENT if not improving.  She has a mild cough but her COVID test is negative and she did not have any alarming symptoms such as fever, hemoptysis, weight loss, etc.  I do not think x-ray or further workup is emergently needed.  Will discharge with these meds and return precautions.        Final Clinical  Impression(s) / ED Diagnoses Final diagnoses:  Anosmia    Rx / DC Orders ED Discharge Orders     None         Pricilla Loveless, MD 01/13/23 [phone]

## 2023-01-13 NOTE — ED Triage Notes (Signed)
Pt. Stated, I've not had any taste or smell for 2 months.

## 2023-01-13 NOTE — Discharge Instructions (Signed)
We are treating you for allergies which could cause the lack of smell you have been experiencing.  Start the antihistamine Claritin as well as the nasal spray.  Follow-up with ENT in about 2 weeks if you are not improving.  Otherwise follow-up with a primary care physician for general medical care.  If you develop any other new or worsening or concerning symptoms and return to the ER or call 911.

## 2023-01-13 NOTE — ED Notes (Signed)
Got patient vitals patient is resting with call bell in reach 

## 2023-02-01 ENCOUNTER — Ambulatory Visit (INDEPENDENT_AMBULATORY_CARE_PROVIDER_SITE_OTHER): Payer: Medicaid Other | Admitting: Nurse Practitioner

## 2023-02-01 ENCOUNTER — Encounter: Payer: Self-pay | Admitting: Nurse Practitioner

## 2023-02-01 VITALS — BP 123/76 | HR 86 | Resp 16 | Wt 132.0 lb

## 2023-02-01 DIAGNOSIS — F1090 Alcohol use, unspecified, uncomplicated: Secondary | ICD-10-CM

## 2023-02-01 DIAGNOSIS — R430 Anosmia: Secondary | ICD-10-CM

## 2023-02-01 DIAGNOSIS — T7840XA Allergy, unspecified, initial encounter: Secondary | ICD-10-CM

## 2023-02-01 DIAGNOSIS — H543 Unqualified visual loss, both eyes: Secondary | ICD-10-CM

## 2023-02-01 DIAGNOSIS — F17200 Nicotine dependence, unspecified, uncomplicated: Secondary | ICD-10-CM

## 2023-02-01 MED ORDER — CETIRIZINE HCL 10 MG PO TABS: 10.0000 mg | ORAL_TABLET | Freq: Every day | ORAL | 2 refills | Status: AC

## 2023-02-01 MED ORDER — FLUTICASONE PROPIONATE 50 MCG/ACT NA SUSP: 2.0000 | Freq: Every day | NASAL | 6 refills | Status: AC

## 2023-02-01 NOTE — Assessment & Plan Note (Signed)
Patient encouraged to avoid alcohol or limit alcohol to no more than 1 a day.

## 2023-02-01 NOTE — Assessment & Plan Note (Signed)
1. Allergy, initial encounter  - cetirizine (ZYRTEC ALLERGY) 10 MG tablet; Take 1 tablet (10 mg total) by mouth daily.  Dispense: 30 tablet; Refill: 2 - fluticasone (FLONASE) 50 MCG/ACT nasal spray; Place 2 sprays into both nostrils daily.  Dispense: 16 g; Refill: 6 Smoking cessation encouraged

## 2023-02-01 NOTE — Assessment & Plan Note (Signed)
Patient encouraged to follow-up with ENT specialist

## 2023-02-01 NOTE — Progress Notes (Signed)
Pt presents for ED f/u -states that she needs referral to ENT -pt states the Claritin is not working

## 2023-02-01 NOTE — Progress Notes (Signed)
New Patient Office Visit  Subjective:  Patient ID: Renee Conner, female    DOB: Oct 31, 1960  Age: 62 y.o. MRN: [id number]  CC:  Chief Complaint  Patient presents with   Follow-up    HPI Renee Conner is a 62 y.o. female with past medical history of tobacco use disorder, hormone use disorder, allergies who presents to establish care.  Patient was at the emergency department on 01/13/2023 for complaint of lack of taste and smell going on for months.  She also complains of dryness, sneezing, stuffiness intermittent dry cough.  She denies fever, chills, abdominal pain nausea vomiting.  She has been taking Claritin states that Claritin does not work. COVID test was negative at the emergency department.  She continues to have loss of taste and smell.   Tobacco use disorder.  Started smoking in her teenage years.  1 pack of cigarettes lasts her 2 weeks.  She denies shortness of breath, wheezing.  Has intermittent dry cough.  Alcohol use disorder.  Drinks 2-3 beers daily since her teenage years.  She denies abdominal pain nausea vomiting   Patient refused mammogram refused a Pap smear and she is not interested in having labs done.       Past Medical History:  Diagnosis Date   Alcohol use disorder    Allergy    Tobacco use disorder     History reviewed. No pertinent surgical history.  Family History  Problem Relation Age of Onset   Heart attack Mother    Throat cancer Father     Social History   Socioeconomic History   Marital status: Single    Spouse name: Not on file   Number of children: 4   Years of education: Not on file   Highest education level: Not on file  Occupational History   Not on file  Tobacco Use   Smoking status: Every Day    Current packs/day: 1.00    Types: Cigarettes   Smokeless tobacco: Never  Substance and Sexual Activity   Alcohol use: Yes    Alcohol/week: 6.0 standard drinks of alcohol    Types: 6 Cans of beer per week    Comment: 2-3  beer  daily.   Drug use: Not Currently    Types: Marijuana   Sexual activity: Yes  Other Topics Concern   Not on file  Social History Narrative   Lives home alone    Social Determinants of Health   Financial Resource Strain: Not on file  Food Insecurity: Not on file  Transportation Needs: Not on file  Physical Activity: Not on file  Stress: Not on file  Social Connections: Not on file  Intimate Partner Violence: Not on file    ROS Review of Systems  Constitutional:  Negative for activity change, appetite change, chills, fatigue and fever.  HENT:  Positive for sneezing. Negative for congestion, dental problem, ear discharge, ear pain, hearing loss, rhinorrhea, sinus pressure, sinus pain and sore throat.   Eyes:  Positive for visual disturbance. Negative for pain, discharge, redness and itching.  Respiratory:  Positive for cough. Negative for chest tightness, shortness of breath and wheezing.   Cardiovascular:  Negative for chest pain, palpitations and leg swelling.  Gastrointestinal:  Negative for abdominal distention, abdominal pain, anal bleeding, blood in stool, constipation, diarrhea, nausea, rectal pain and vomiting.  Endocrine: Negative for cold intolerance, heat intolerance, polydipsia, polyphagia and polyuria.  Genitourinary:  Negative for difficulty urinating, dysuria, flank pain, frequency, hematuria, menstrual problem, pelvic pain  and vaginal bleeding.  Musculoskeletal:  Negative for arthralgias, back pain, gait problem, joint swelling and myalgias.  Skin:  Negative for color change, pallor, rash and wound.  Allergic/Immunologic: Negative for environmental allergies, food allergies and immunocompromised state.  Neurological:  Negative for dizziness, tremors, facial asymmetry, weakness and headaches.  Hematological:  Negative for adenopathy. Does not bruise/bleed easily.  Psychiatric/Behavioral:  Negative for agitation, behavioral problems, confusion, decreased  concentration, hallucinations, self-injury and suicidal ideas.     Objective:   Today's Vitals: BP 123/76 (BP Location: Left Arm, Patient Position: Sitting, Cuff Size: Normal)   Pulse 86   Resp 16   Wt 132 lb (59.9 kg)   SpO2 100%   BMI 22.66 kg/m   Physical Exam Vitals and nursing note reviewed.  Constitutional:      General: She is not in acute distress.    Appearance: Normal appearance. She is not ill-appearing, toxic-appearing or diaphoretic.  HENT:     Right Ear: Tympanic membrane, ear canal and external ear normal. There is no impacted cerumen.     Left Ear: Tympanic membrane, ear canal and external ear normal. There is no impacted cerumen.     Nose: No congestion or rhinorrhea.     Mouth/Throat:     Mouth: Mucous membranes are moist.     Pharynx: Oropharynx is clear. No oropharyngeal exudate or posterior oropharyngeal erythema.  Eyes:     General: No scleral icterus.       Right eye: No discharge.        Left eye: No discharge.     Extraocular Movements: Extraocular movements intact.     Conjunctiva/sclera: Conjunctivae normal.  Cardiovascular:     Rate and Rhythm: Normal rate and regular rhythm.     Pulses: Normal pulses.     Heart sounds: Normal heart sounds. No murmur heard.    No friction rub. No gallop.  Pulmonary:     Effort: Pulmonary effort is normal. No respiratory distress.     Breath sounds: Normal breath sounds. No stridor. No wheezing, rhonchi or rales.  Chest:     Chest wall: No tenderness.  Abdominal:     General: There is no distension.     Palpations: Abdomen is soft.     Tenderness: There is no abdominal tenderness. There is no right CVA tenderness, left CVA tenderness or guarding.  Musculoskeletal:        General: No swelling, tenderness, deformity or signs of injury.     Right lower leg: No edema.     Left lower leg: No edema.  Skin:    General: Skin is warm and dry.     Capillary Refill: Capillary refill takes less than 2 seconds.      Coloration: Skin is not jaundiced or pale.     Findings: No bruising, erythema or lesion.  Neurological:     Mental Status: She is alert and oriented to person, place, and time.     Motor: No weakness.     Coordination: Coordination normal.     Gait: Gait normal.  Psychiatric:        Mood and Affect: Mood normal.        Behavior: Behavior normal.        Thought Content: Thought content normal.        Judgment: Judgment normal.     Assessment & Plan:   Problem List Items Addressed This Visit       Other   Alcohol use disorder  Patient encouraged to avoid alcohol or limit alcohol to no more than 1 a day.        Tobacco use disorder    Smokes about less than one pack/day  Asked about quitting: confirms that he/she currently smokes cigarettes Advise to quit smoking: Educated about QUITTING to reduce the risk of cancer, cardio and cerebrovascular disease. Assess willingness: Unwilling to quit at this time, not  working on cutting back. Assist with counseling and pharmacotherapy: Counseled for 5 minutes and literature provided. Arrange for follow up: follow up in 2 months and continue to offer help.       Allergies - Primary    1. Allergy, initial encounter  - cetirizine (ZYRTEC ALLERGY) 10 MG tablet; Take 1 tablet (10 mg total) by mouth daily.  Dispense: 30 tablet; Refill: 2 - fluticasone (FLONASE) 50 MCG/ACT nasal spray; Place 2 sprays into both nostrils daily.  Dispense: 16 g; Refill: 6 Smoking cessation encouraged      Relevant Medications   cetirizine (ZYRTEC ALLERGY) 10 MG tablet   fluticasone (FLONASE) 50 MCG/ACT nasal spray   Anosmia    Patient encouraged to follow-up with ENT specialist      Other Visit Diagnoses     Impaired vision in both eyes       Relevant Orders   Ambulatory referral to Ophthalmology       Outpatient Encounter Medications as of 02/01/2023  Medication Sig   cetirizine (ZYRTEC ALLERGY) 10 MG tablet Take 1 tablet (10 mg total) by  mouth daily.   fluticasone (FLONASE) 50 MCG/ACT nasal spray Place 2 sprays into both nostrils daily.   [DISCONTINUED] fluticasone (FLONASE) 50 MCG/ACT nasal spray Place 1-2 sprays into both nostrils daily.   [DISCONTINUED] loratadine (CLARITIN) 10 MG tablet Take 1 tablet (10 mg total) by mouth daily.   hydrOXYzine (ATARAX/VISTARIL) 25 MG tablet Take 1 tablet (25 mg total) by mouth every 8 (eight) hours as needed for itching. (Patient not taking: Reported on 02/01/2023)   meloxicam (MOBIC) 7.5 MG tablet Take 1 tablet (7.5 mg total) by mouth daily.   [DISCONTINUED] predniSONE (STERAPRED UNI-PAK 21 TAB) 10 MG (21) TBPK tablet Take by mouth daily. Take 6 tabs by mouth daily  for 2 days, then 5 tabs for 2 days, then 4 tabs for 2 days, then 3 tabs for 2 days, 2 tabs for 2 days, then 1 tab by mouth daily for 2 days (Patient not taking: Reported on 02/01/2023)   No facility-administered encounter medications on file as of 02/01/2023.    Follow-up: Return in about 2 months (around 04/03/2023) for allergies.   Donell Beers, FNP

## 2023-02-01 NOTE — Patient Instructions (Addendum)
Schedule an appointment with Scarlette Ar, MD (Otolaryngology) Ear throat and nose doctor [address] [place], [place], [place] [address] Phone: [phone]  1. Allergy, initial encounter  - cetirizine (ZYRTEC ALLERGY) 10 MG tablet; Take 1 tablet (10 mg total) by mouth daily.  Dispense: 30 tablet; Refill: 2 - fluticasone (FLONASE) 50 MCG/ACT nasal spray; Place 2 sprays into both nostrils daily.  Dispense: 16 g; Refill: 6  2. Tobacco use disorder   3. Alcohol use disorder   It is important that you exercise regularly at least 30 minutes 5 times a week as tolerated  Think about what you will eat, plan ahead. Choose " clean, green, fresh or frozen" over canned, processed or packaged foods which are more sugary, salty and fatty. 70 to 75% of food eaten should be vegetables and fruit. Three meals at set times with snacks allowed between meals, but they must be fruit or vegetables. Aim to eat over a 12 hour period , example 7 am to 7 pm, and STOP after  your last meal of the day. Drink water,generally about 64 ounces per day, no other drink is as healthy. Fruit juice is best enjoyed in a healthy way, by EATING the fruit.  Thanks for choosing [facility] we consider it a privelige to serve you.

## 2023-02-01 NOTE — Assessment & Plan Note (Signed)
Smokes about less than one pack/day  Asked about quitting: confirms that he/she currently smokes cigarettes Advise to quit smoking: Educated about QUITTING to reduce the risk of cancer, cardio and cerebrovascular disease. Assess willingness: Unwilling to quit at this time, not  working on cutting back. Assist with counseling and pharmacotherapy: Counseled for 5 minutes and literature provided. Arrange for follow up: follow up in 2 months and continue to offer help.

## 2023-04-03 ENCOUNTER — Ambulatory Visit: Payer: Self-pay | Admitting: Nurse Practitioner

## 2023-07-22 DIAGNOSIS — H5213 Myopia, bilateral: Secondary | ICD-10-CM | POA: Diagnosis not present

## 2024-07-04 ENCOUNTER — Emergency Department (HOSPITAL_COMMUNITY)

## 2024-07-04 ENCOUNTER — Encounter (HOSPITAL_COMMUNITY): Payer: Self-pay

## 2024-07-04 ENCOUNTER — Emergency Department (HOSPITAL_COMMUNITY)
Admission: EM | Admit: 2024-07-04 | Discharge: 2024-07-04 | Disposition: A | Discharge status: Home / Self Care | Attending: Emergency Medicine | Admitting: Emergency Medicine

## 2024-07-04 DIAGNOSIS — Z79899 Other long term (current) drug therapy: Secondary | ICD-10-CM | POA: Diagnosis not present

## 2024-07-04 DIAGNOSIS — L240 Irritant contact dermatitis due to detergents: Secondary | ICD-10-CM | POA: Insufficient documentation

## 2024-07-04 DIAGNOSIS — R21 Rash and other nonspecific skin eruption: Secondary | ICD-10-CM | POA: Diagnosis present

## 2024-07-04 LAB — CBC WITH DIFFERENTIAL/PLATELET
Abs Immature Granulocytes: 0.01 10*3/uL (ref 0.00–0.07)
Basophils Absolute: 0.1 10*3/uL (ref 0.0–0.1)
Basophils Relative: 1 %
Eosinophils Absolute: 0.1 10*3/uL (ref 0.0–0.5)
Eosinophils Relative: 2 %
HCT: 45.5 % (ref 36.0–46.0)
Hemoglobin: 15.9 g/dL — ABNORMAL HIGH (ref 12.0–15.0)
Immature Granulocytes: 0 %
Lymphocytes Relative: 29 %
Lymphs Abs: 1.2 10*3/uL (ref 0.7–4.0)
MCH: 34.3 pg — ABNORMAL HIGH (ref 26.0–34.0)
MCHC: 34.9 g/dL (ref 30.0–36.0)
MCV: 98.1 fL (ref 80.0–100.0)
Monocytes Absolute: 0.3 10*3/uL (ref 0.1–1.0)
Monocytes Relative: 6 %
Neutro Abs: 2.5 10*3/uL (ref 1.7–7.7)
Neutrophils Relative %: 62 %
Platelets: 233 10*3/uL (ref 150–400)
RBC: 4.64 MIL/uL (ref 3.87–5.11)
RDW: 12.8 % (ref 11.5–15.5)
WBC: 4.1 10*3/uL (ref 4.0–10.5)
nRBC: 0 % (ref 0.0–0.2)

## 2024-07-04 LAB — COMPREHENSIVE METABOLIC PANEL WITH GFR
ALT: 26 U/L (ref 0–44)
AST: 31 U/L (ref 15–41)
Albumin: 4.6 g/dL (ref 3.5–5.0)
Alkaline Phosphatase: 102 U/L (ref 38–126)
Anion gap: 13 (ref 5–15)
BUN: 6 mg/dL — ABNORMAL LOW (ref 8–23)
CO2: 24 mmol/L (ref 22–32)
Calcium: 9.8 mg/dL (ref 8.9–10.3)
Chloride: 101 mmol/L (ref 98–111)
Creatinine, Ser: 0.74 mg/dL (ref 0.44–1.00)
GFR, Estimated: >60 mL/min
Glucose, Bld: 92 mg/dL (ref 70–99)
Potassium: 3.7 mmol/L (ref 3.5–5.1)
Sodium: 138 mmol/L (ref 135–145)
Total Bilirubin: 0.6 mg/dL (ref 0.0–1.2)
Total Protein: 8.2 g/dL — ABNORMAL HIGH (ref 6.5–8.1)

## 2024-07-04 MED ORDER — HYDROCORTISONE 1 % EX CREA: TOPICAL_CREAM | CUTANEOUS | 0 refills | Status: AC

## 2024-07-04 NOTE — ED Provider Triage Note (Signed)
 Emergency Medicine Provider Triage Evaluation Note  Renee Conner , a 64 y.o. female  was evaluated in triage.  Pt complains of rash.  Patient presents with concerns of a rash to the upper back is been present for about 4 weeks not improving.  She does also note that she has had a mild productive cough for about 4 weeks over this time as well.  She is not immunocompromise as far she is aware.  No fever, chills or bodyaches.  Review of Systems  Positive: As above Negative: As above  Physical Exam  BP (!) 167/107 (BP Location: Right Arm)   Pulse (!) 111   Temp 97.7 F (36.5 C) (Oral)   Resp 17   SpO2 100%  Gen:   Awake, no distress   Resp:  Normal effort MSK:   Moves extremities without difficulty  Other:  Area of darkened skin to the upper back with a central ulceration present.  No drainage from the site.  No induration or erythema.  Medical Decision Making  Medically screening exam initiated at 12:18 PM.  Appropriate orders placed.  Aviance [place] was informed that the remainder of the evaluation will be completed by another provider, this initial triage assessment does not replace that evaluation, and the importance of remaining in the ED until their evaluation is complete.     Lennin Osmond A, PA-C 07/04/24 1218

## 2024-07-04 NOTE — ED Provider Notes (Signed)
 " [place] [facility] AT [facility] Provider Note   CSN: [id number] Arrival date & time: 07/04/24  1136     Patient presents with: Rash   Renee Conner is a 64 y.o. female.  Patient presents to the emergency department today with concerns of rash on her back.  Reports the rash is present for 4 weeks and is itchy and has some skin peeling.  Denies any obvious contact with anyone she can recall.  Denies any new medications.  No reported fever, chills or bodyaches.   Rash      Prior to Admission medications  Medication Sig Start Date End Date Taking? Authorizing Provider  hydrocortisone  cream 1 % Apply to affected area 2 times daily 07/04/24  Yes Natori Gudino, [place] LABOR, PA-C  cetirizine  (ZYRTEC  ALLERGY) 10 MG tablet Take 1 tablet (10 mg total) by mouth daily. 02/01/23 02/01/24  Paseda, Folashade R, FNP  fluticasone  (FLONASE ) 50 MCG/ACT nasal spray Place 2 sprays into both nostrils daily. 02/01/23   Paseda, Folashade R, FNP  hydrOXYzine  (ATARAX /VISTARIL ) 25 MG tablet Take 1 tablet (25 mg total) by mouth every 8 (eight) hours as needed for itching. Patient not taking: Reported on 02/01/2023 01/06/21   Beverley Leita LABOR, PA-C  meloxicam  (MOBIC ) 7.5 MG tablet Take 1 tablet (7.5 mg total) by mouth daily. 10/01/16   Geiple, Joshua, PA-C    Allergies: Patient has no known allergies.    Review of Systems  Skin:  Positive for rash.  All other systems reviewed and are negative.   Updated Vital Signs BP (!) 160/100   Pulse (!) 101   Temp 97.7 F (36.5 C) (Oral)   Resp 16   SpO2 100%   Physical Exam Vitals and nursing note reviewed.  Constitutional:      General: She is not in acute distress.    Appearance: She is well-developed.  HENT:     Head: Normocephalic and atraumatic.  Eyes:     Conjunctiva/sclera: Conjunctivae normal.  Cardiovascular:     Rate and Rhythm: Normal rate and regular rhythm.     Heart sounds: No murmur heard. Pulmonary:     Effort: Pulmonary effort  is normal. No respiratory distress.     Breath sounds: Normal breath sounds.  Abdominal:     Palpations: Abdomen is soft.     Tenderness: There is no abdominal tenderness.  Musculoskeletal:        General: No swelling.     Cervical back: Neck supple.  Skin:    General: Skin is warm and dry.     Capillary Refill: Capillary refill takes less than 2 seconds.     Findings: Rash present.     Comments: Flaky rash with a central area of slight ulceration the patient reports was a scab that she had removed.  Developed localized to the upper back.  There is no erythema, induration, or drainage present.  Neurological:     Mental Status: She is alert.  Psychiatric:        Mood and Affect: Mood normal.     (all labs ordered are listed, but only abnormal results are displayed) Labs Reviewed  COMPREHENSIVE METABOLIC PANEL WITH GFR - Abnormal; Notable for the following components:      Result Value   BUN 6 (*)    Total Protein 8.2 (*)    All other components within normal limits  CBC WITH DIFFERENTIAL/PLATELET - Abnormal; Notable for the following components:   Hemoglobin 15.9 (*)  MCH 34.3 (*)    All other components within normal limits    EKG: None  Radiology: DG Chest 2 View Result Date: 07/04/2024 EXAM: 2 VIEW(S) XRAY OF THE CHEST 07/04/2024 12:28:38 PM COMPARISON: None available. CLINICAL HISTORY: Cough for more than 2 weeks. FINDINGS: LUNGS AND PLEURA: No focal pulmonary opacity. No pleural effusion. No pneumothorax. HEART AND MEDIASTINUM: No acute abnormality of the cardiac and mediastinal silhouettes. BONES AND SOFT TISSUES: No acute osseous abnormality. IMPRESSION: 1. No acute cardiopulmonary process. Electronically signed by: Norleen Boxer MD 07/04/2024 01:38 PM EST RP Workstation: [id number]     Procedures   Medications Ordered in the ED - No data to display                                  Medical Decision Making Amount and/or Complexity of Data Reviewed Radiology:  ordered.   This patient presents to the ED for concern of rash.  Differential diagnosis includes allergic reaction, contact dermatitis, irritant dermatitis, medication reaction    Additional history obtained:  Additional history obtained from chart review   Lab Tests:  I Ordered, and personally interpreted labs.  The pertinent results include: CBC unremarkable, CMP unremarkable   Imaging Studies ordered:  I ordered imaging studies including chest x-ray I independently visualized and interpreted imaging which showed negative for any acute cardiopulmonary findings I agree with the radiologist interpretation   Problem List / ED Course:  Patient presents to the emergency department today with concerns of a rash.  Reportedly has had a rash present for about 4 weeks without significant treatment.  She reports the rash is itchy and she has occasionally been scratching at the area and causing scabbing to occur.  She reports some darkening the skin and some flaking in this area.  She denies any other significant symptoms.  She has tried lotions in this area with continues to have persistent itching. On exam, patient has an area of skin darkening with some flaking towards the upper back.  There is no obvious signs of infection with no erythema, induration, or drainage present.  The area is nontender on my exam. Patient did report some concerns about a cough has been ongoing for several weeks as well.  Will obtain imaging as well as basic labs for further evaluation. Workup is negative.  Based on appearance, suspect this is likely an irritant contact.  Has been using high amounts of detergent when using her laundry and typically notices that the rash worsens after applying freshly washed close.  Advised reducing irritating detergents.  Prescribed a topical hydrocortisone  cream to use over this area.  Encourage patient to follow-up closely with her PCP or return to the emergency department for any  concerns or worsening symptoms.  I do feel that patient has worsening symptoms, she may ultimately benefit from dermatology evaluation.  She is otherwise stable this time for outpatient follow-up and discharged home.   Social Determinants of Health:  None  Final diagnoses:  Irritant contact dermatitis due to detergent    ED Discharge Orders          Ordered    hydrocortisone  cream 1 %        07/04/24 1643               Cecily Legrand DELENA DEVONNA 07/04/24 2127    Palumbo, April, MD 07/04/24 2144  "

## 2024-07-04 NOTE — ED Triage Notes (Signed)
 Pt presents with c/o rash on her back. Pt reports the rash has been present for 4 weeks. Pt reports her skin is itching and peeling.

## 2024-07-04 NOTE — Discharge Instructions (Signed)
 You were seen in the emergency department today for concerns of a rash.  This rash appears to be likely caused by contact with detergents.  I would recommend reducing, detergents or using as this will likely improve this.  I started on a steroid cream either way to help with some of this irritation.  Please use this as directed twice daily for the next week or so.  If the area is not improving or worsening, you may require further evaluation and possible testing of the skin itself.
